# Patient Record
Sex: Female | Born: 1965 | Race: Asian | Hispanic: No | Marital: Married | Smoking: Never smoker
Health system: Southern US, Community
[De-identification: ages and names within clinical notes are randomized; demographics above are authoritative.]

## PROBLEM LIST (undated history)

## (undated) DIAGNOSIS — E119 Type 2 diabetes mellitus without complications: Secondary | ICD-10-CM

## (undated) DIAGNOSIS — I10 Essential (primary) hypertension: Secondary | ICD-10-CM

## (undated) HISTORY — DX: Essential (primary) hypertension: I10

## (undated) HISTORY — DX: Type 2 diabetes mellitus without complications: E11.9

## (undated) HISTORY — PX: APPENDECTOMY: SHX54

---

## 2015-09-01 ENCOUNTER — Ambulatory Visit (INDEPENDENT_AMBULATORY_CARE_PROVIDER_SITE_OTHER): Payer: Self-pay | Admitting: Family Medicine

## 2015-09-01 ENCOUNTER — Ambulatory Visit (INDEPENDENT_AMBULATORY_CARE_PROVIDER_SITE_OTHER): Payer: Self-pay

## 2015-09-01 VITALS — BP 155/97 | HR 107 | Temp 98.3°F | Resp 16

## 2015-09-01 DIAGNOSIS — M25572 Pain in left ankle and joints of left foot: Secondary | ICD-10-CM

## 2015-09-01 NOTE — Patient Instructions (Signed)
Because you received an x-ray today, you will receive an invoice from [facility]. Please contact [facility] at [phone] with questions or concerns regarding your invoice. Our billing staff will not be able to assist you with those questions. °

## 2015-09-01 NOTE — Progress Notes (Signed)
By signing my name below, I, Rawaa Al Rifaie, attest that this documentation has been prepared under the direction and in the presence of Elvina Sidle, MD.  Watt Climes Rifaie, Medical Scribe. 09/01/2015.  3:42 PM.  Patient ID: Suzanne Flynn MRN: [id number], DOB: September 29, 1965, 50 y.o. Date of Encounter: 09/01/2015  Primary Physician: No primary care provider on file.  Chief Complaint:  Chief Complaint  Patient presents with  . Leg Pain    left    HPI:  Suzanne Flynn is a 50 y.o. female who presents to [facility] with her son in law as an interpreter complaining of left leg injury that occurred today.  Pt was at the gym today, and while walking, she rolled her ankle and fell. Pt reports that the area is very tender to the touch, and has associated swelling.   Pt is here visiting from [place]. She plans to stay here for 6 months.   Past Medical History  Diagnosis Date  . Hypertension   . Diabetes mellitus without complication (HCC)      Home Meds: Prior to Admission medications   Medication Sig Start Date End Date Taking? Authorizing Provider  UNABLE TO FIND Med Name: metapro xl  25 mg   Yes Historical Provider, MD  UNABLE TO FIND siptin 100   Yes Historical Provider, MD    Allergies: No Known Allergies  Social History   Social History  . Marital Status: Married    Spouse Name: N/A  . Number of Children: N/A  . Years of Education: N/A   Occupational History  . Not on file.   Social History Main Topics  . Smoking status: Never Smoker   . Smokeless tobacco: Not on file  . Alcohol Use: Not on file  . Drug Use: Not on file  . Sexual Activity: Not on file   Other Topics Concern  . Not on file   Social History Narrative  . No narrative on file     Review of Systems: Constitutional: negative for chills, fever, night sweats, weight changes, or fatigue  HEENT: negative for vision changes, hearing loss, congestion, rhinorrhea, ST, epistaxis, or  sinus pressure Cardiovascular: negative for chest pain or palpitations Respiratory: negative for hemoptysis, wheezing, shortness of breath, or cough Abdominal: negative for abdominal pain, nausea, vomiting, diarrhea, or constipation Dermatological: negative for rash Neurologic: negative for headache, dizziness, or syncope Msk: Positive for arthralgia, joint swelling.  All other systems reviewed and are otherwise negative with the exception to those above and in the HPI.  Physical Exam: Blood pressure 155/97, pulse 107, temperature 98.3 F (36.8 C), temperature source Oral, resp. rate 16, SpO2 98 %., There is no height or weight on file to calculate BMI. General: Well developed, well nourished, in no acute distress. Head: Normocephalic, atraumatic, eyes without discharge, sclera non-icteric, nares are without discharge. Bilateral auditory canals clear, TM's are without perforation, pearly grey and translucent with reflective cone of light bilaterally. Oral cavity moist, posterior pharynx without exudate, erythema, peritonsillar abscess, or post nasal drip.  Neck: Supple. No thyromegaly. Full ROM. No lymphadenopathy. Lungs: Clear bilaterally to auscultation without wheezes, rales, or rhonchi. Breathing is unlabored. Heart: RRR with S1 S2. No murmurs, rubs, or gallops appreciated. Abdomen: Soft, non-tender, non-distended with normoactive bowel sounds. No hepatomegaly. No rebound/guarding. No obvious abdominal masses. Msk:  Strength and tone normal for age. Swollen and tender lateral malleolus. No left foot tenderness.  Extremities/Skin: Warm and dry. No clubbing or cyanosis. No  edema. No rashes or suspicious lesions. Neuro: Alert and oriented X 3. Moves all extremities spontaneously. Gait is normal. CNII-XII grossly in tact. Psych:  Responds to questions appropriately with a normal affect.   Dg Ankle Complete Left  09/01/2015  CLINICAL DATA:  Leg injury today with ankle pain EXAM: LEFT ANKLE  COMPLETE - 3+ VIEW COMPARISON:  None. FINDINGS: There is no evidence of fracture, dislocation, or joint effusion. There is no evidence of arthropathy or other focal bone abnormality. Soft tissues are unremarkable. IMPRESSION: Negative. Electronically Signed   By: Marlan Palau M.D.   On: 09/01/2015 16:32    Labs:  ASSESSMENT AND PLAN:  50 y.o. year old female with ankle sprain:  Elevated, ice compression tonight.  Crutches for several days  This chart was scribed in my presence and reviewed by me personally.    ICD-9-CM ICD-10-CM   1. Pain in joint, ankle and foot, left 719.47 M25.572 DG Ankle Complete Left     Signed, Elvina Sidle, MD  Signed, Elvina Sidle, MD 09/01/2015 3:38 PM

## 2017-05-29 IMAGING — CR DG ANKLE COMPLETE 3+V*L*
4 series · 4 of 4 positions shown · non-contrast
Comparison: None.

CLINICAL DATA: Leg injury today with ankle pain

EXAM:
LEFT ANKLE COMPLETE - 3+ VIEW

[AP]
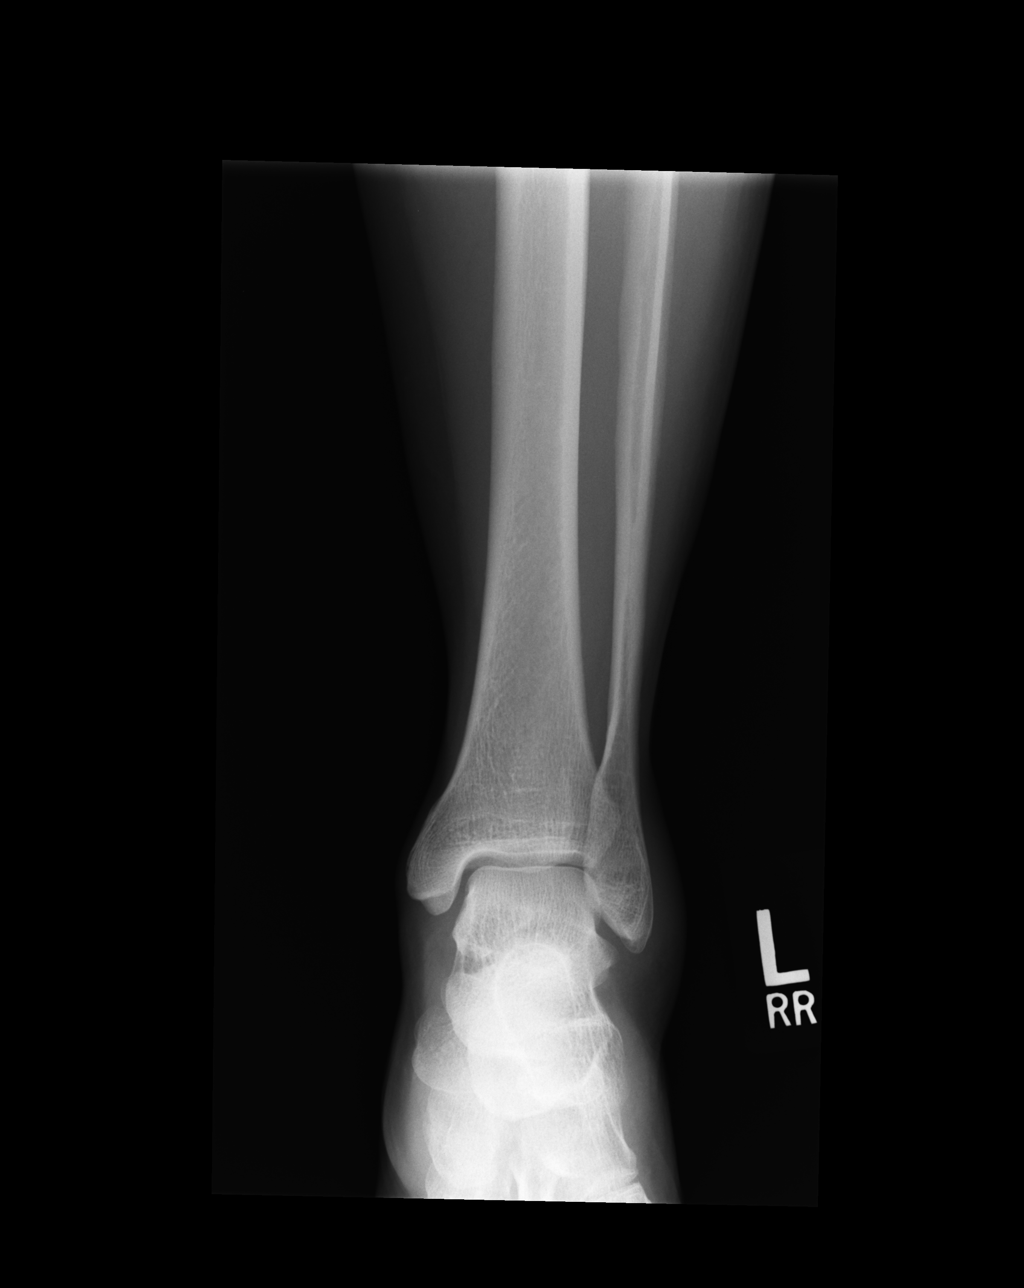

[ap obl int rot (1 of 2)]
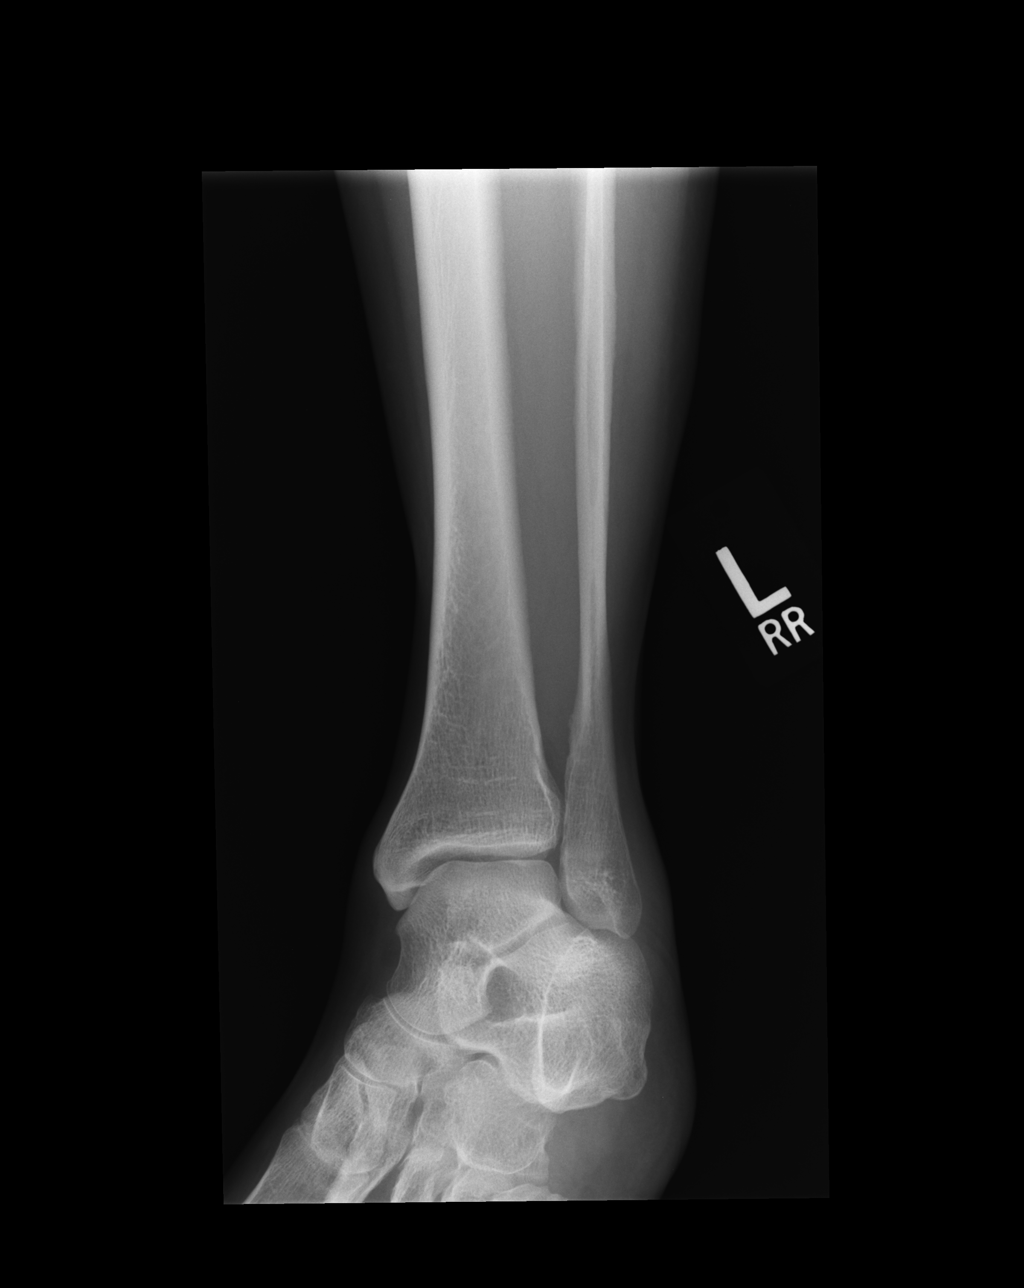

[ap obl int rot (2 of 2)]
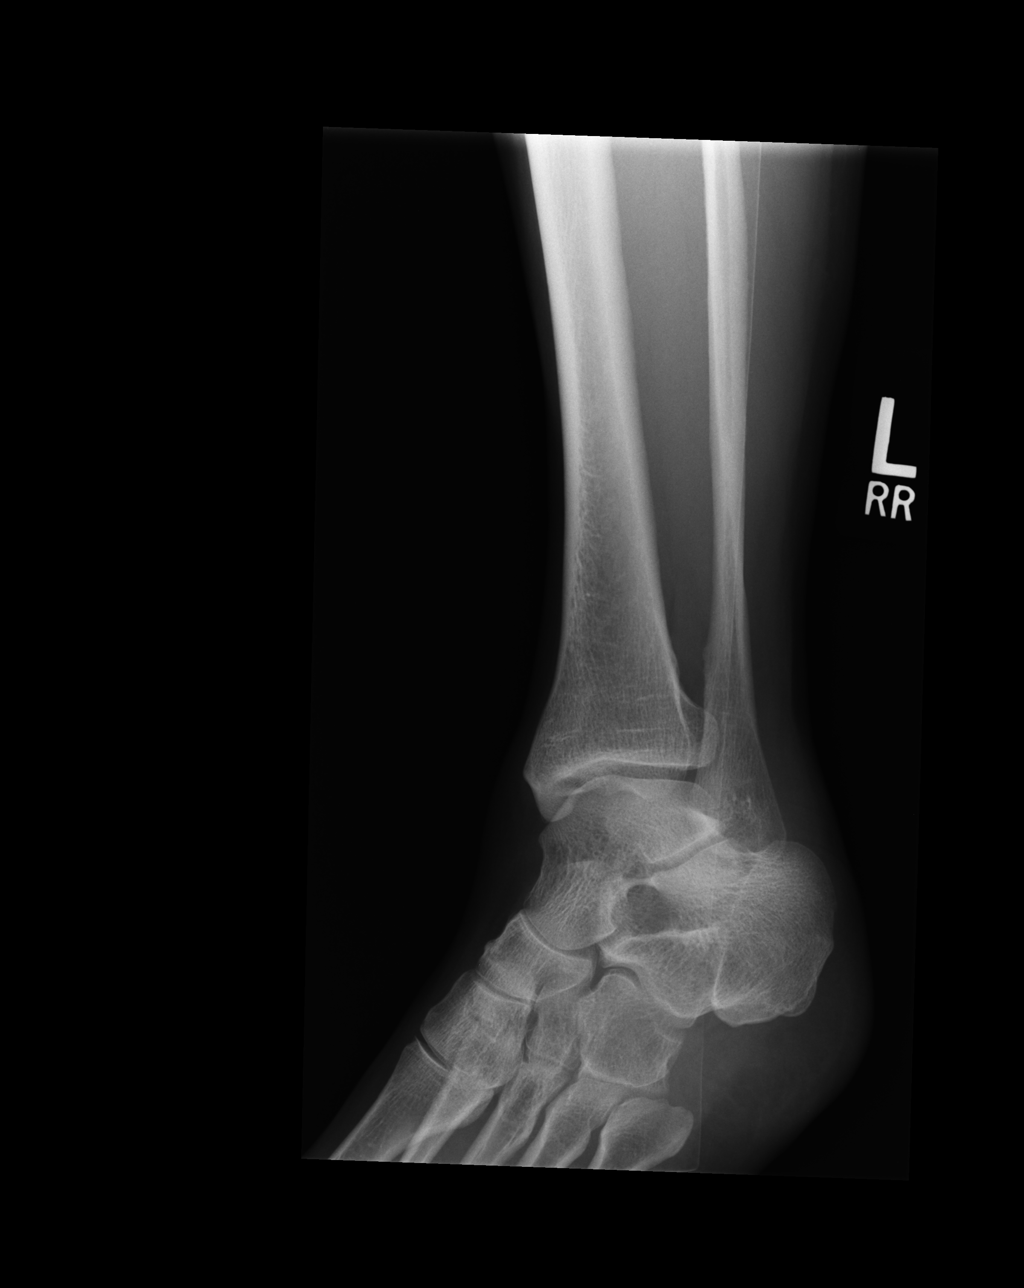

[lateral]
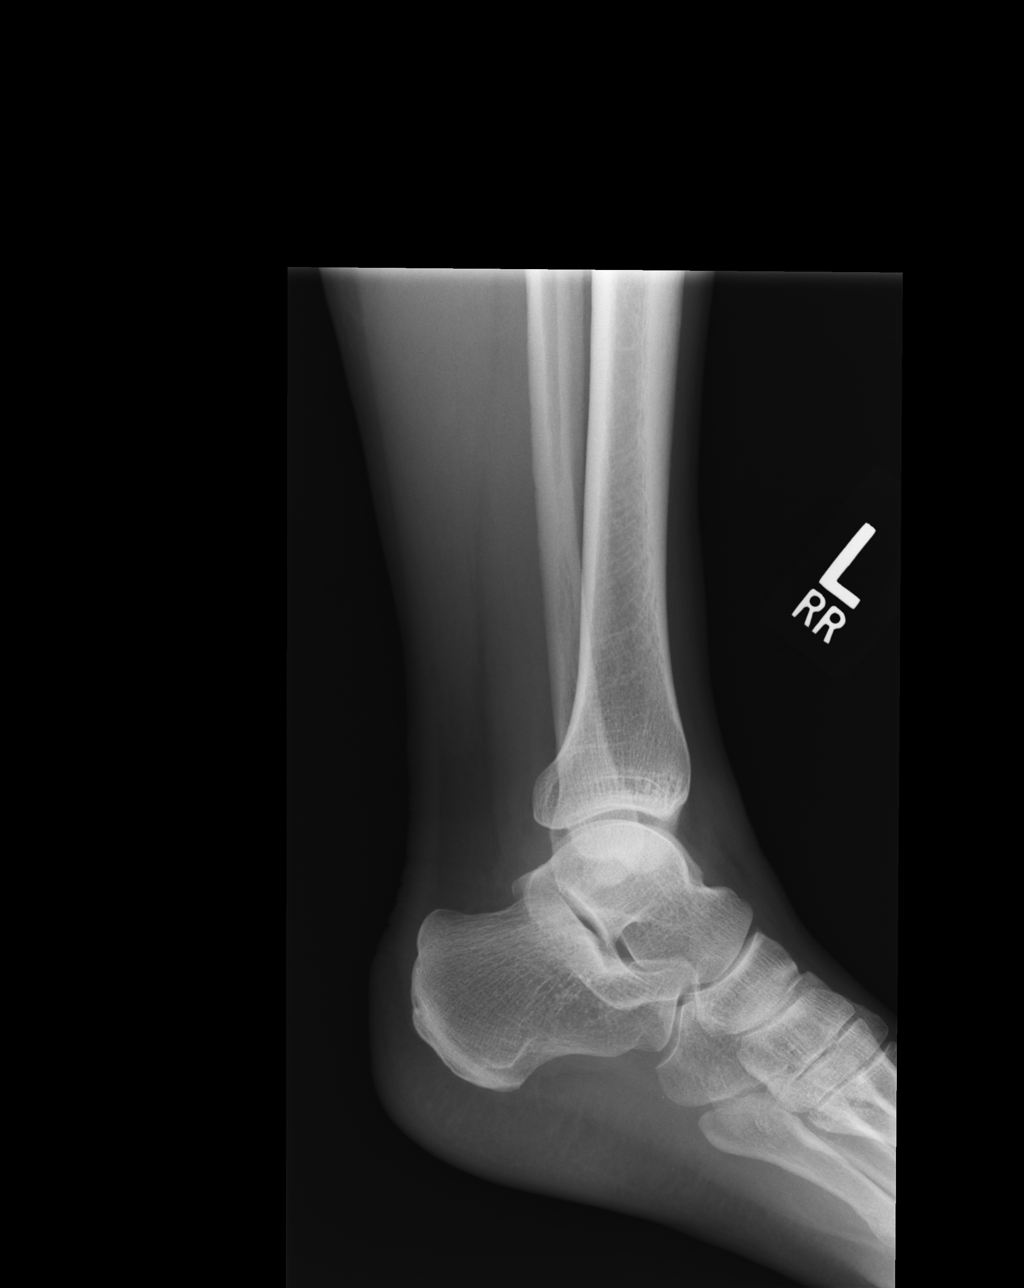

[4 of 4 positions shown; findings below may reference images not displayed]

FINDINGS: There is no evidence of fracture, dislocation, or joint effusion.
There is no evidence of arthropathy or other focal bone abnormality.
Soft tissues are unremarkable.
IMPRESSION: Negative.
